# Patient Record
Sex: Female | Born: 1985 | Race: White | Hispanic: Yes | Marital: Single | Smoking: Former smoker
Health system: Southern US, Community
[De-identification: ages and names within clinical notes are randomized; demographics above are authoritative.]

## PROBLEM LIST (undated history)

## (undated) DIAGNOSIS — R87619 Unspecified abnormal cytological findings in specimens from cervix uteri: Secondary | ICD-10-CM

## (undated) DIAGNOSIS — F32A Depression, unspecified: Secondary | ICD-10-CM

## (undated) DIAGNOSIS — IMO0002 Reserved for concepts with insufficient information to code with codable children: Secondary | ICD-10-CM

## (undated) DIAGNOSIS — F329 Major depressive disorder, single episode, unspecified: Secondary | ICD-10-CM

## (undated) DIAGNOSIS — E7800 Pure hypercholesterolemia, unspecified: Secondary | ICD-10-CM

## (undated) DIAGNOSIS — F419 Anxiety disorder, unspecified: Secondary | ICD-10-CM

## (undated) HISTORY — DX: Anxiety disorder, unspecified: F41.9

## (undated) HISTORY — DX: Pure hypercholesterolemia, unspecified: E78.00

## (undated) HISTORY — DX: Reserved for concepts with insufficient information to code with codable children: IMO0002

## (undated) HISTORY — DX: Depression, unspecified: F32.A

## (undated) HISTORY — DX: Major depressive disorder, single episode, unspecified: F32.9

## (undated) HISTORY — DX: Unspecified abnormal cytological findings in specimens from cervix uteri: R87.619

---

## 2010-09-28 HISTORY — PX: COLPOSCOPY: SHX161

## 2011-03-27 ENCOUNTER — Other Ambulatory Visit: Payer: Self-pay | Admitting: Obstetrics & Gynecology

## 2011-03-27 ENCOUNTER — Encounter (INDEPENDENT_AMBULATORY_CARE_PROVIDER_SITE_OTHER): Payer: Self-pay | Admitting: Obstetrics & Gynecology

## 2011-03-27 ENCOUNTER — Other Ambulatory Visit (HOSPITAL_COMMUNITY)
Admission: RE | Admit: 2011-03-27 | Discharge: 2011-03-27 | Disposition: A | Discharge status: Home / Self Care | Payer: Self-pay | Source: Ambulatory Visit | Attending: Obstetrics & Gynecology | Admitting: Obstetrics & Gynecology

## 2011-03-27 DIAGNOSIS — R87619 Unspecified abnormal cytological findings in specimens from cervix uteri: Secondary | ICD-10-CM | POA: Insufficient documentation

## 2011-03-27 DIAGNOSIS — R87610 Atypical squamous cells of undetermined significance on cytologic smear of cervix (ASC-US): Secondary | ICD-10-CM

## 2011-05-06 ENCOUNTER — Encounter: Payer: Self-pay | Admitting: *Deleted

## 2011-05-06 DIAGNOSIS — F32A Depression, unspecified: Secondary | ICD-10-CM | POA: Insufficient documentation

## 2011-05-06 DIAGNOSIS — E7800 Pure hypercholesterolemia, unspecified: Secondary | ICD-10-CM | POA: Insufficient documentation

## 2011-05-06 DIAGNOSIS — F419 Anxiety disorder, unspecified: Secondary | ICD-10-CM | POA: Insufficient documentation

## 2011-05-06 DIAGNOSIS — F329 Major depressive disorder, single episode, unspecified: Secondary | ICD-10-CM | POA: Insufficient documentation

## 2011-05-06 DIAGNOSIS — R87612 Low grade squamous intraepithelial lesion on cytologic smear of cervix (LGSIL): Secondary | ICD-10-CM | POA: Insufficient documentation

## 2011-05-06 DIAGNOSIS — R87629 Unspecified abnormal cytological findings in specimens from vagina: Secondary | ICD-10-CM

## 2011-05-06 DIAGNOSIS — IMO0002 Reserved for concepts with insufficient information to code with codable children: Secondary | ICD-10-CM | POA: Insufficient documentation

## 2011-05-14 ENCOUNTER — Ambulatory Visit (INDEPENDENT_AMBULATORY_CARE_PROVIDER_SITE_OTHER): Payer: Self-pay | Admitting: Obstetrics & Gynecology

## 2011-05-14 ENCOUNTER — Encounter: Payer: Self-pay | Admitting: Obstetrics & Gynecology

## 2011-05-14 VITALS — BP 124/84 | HR 91 | Temp 98.1°F | Ht 69.0 in | Wt 219.5 lb

## 2011-05-14 DIAGNOSIS — R87612 Low grade squamous intraepithelial lesion on cytologic smear of cervix (LGSIL): Secondary | ICD-10-CM

## 2011-05-14 DIAGNOSIS — IMO0002 Reserved for concepts with insufficient information to code with codable children: Secondary | ICD-10-CM

## 2011-05-14 NOTE — Progress Notes (Signed)
Patient had LGSIL pap on 12/11/10.  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  For her cervical dysplasia, patient needs pap smears every six months until she has two consecutive normal pap smears, then she can resume annual screening. If any pap smear is abnormal, she will need repeat colposcopy and appropriate evaluation. Patient agrees with plan.

## 2014-07-30 ENCOUNTER — Encounter: Payer: Self-pay | Admitting: Obstetrics & Gynecology

## 2016-04-21 ENCOUNTER — Encounter (HOSPITAL_COMMUNITY): Payer: Self-pay | Admitting: Vascular Surgery

## 2016-04-21 ENCOUNTER — Emergency Department (HOSPITAL_COMMUNITY): Payer: Managed Care, Other (non HMO)

## 2016-04-21 ENCOUNTER — Emergency Department (HOSPITAL_COMMUNITY)
Admission: EM | Admit: 2016-04-21 | Discharge: 2016-04-21 | Disposition: A | Discharge status: Home / Self Care | Payer: Managed Care, Other (non HMO) | Attending: Emergency Medicine | Admitting: Emergency Medicine

## 2016-04-21 DIAGNOSIS — R0789 Other chest pain: Secondary | ICD-10-CM | POA: Diagnosis not present

## 2016-04-21 DIAGNOSIS — N760 Acute vaginitis: Secondary | ICD-10-CM | POA: Diagnosis not present

## 2016-04-21 DIAGNOSIS — B9689 Other specified bacterial agents as the cause of diseases classified elsewhere: Secondary | ICD-10-CM | POA: Diagnosis not present

## 2016-04-21 DIAGNOSIS — R1031 Right lower quadrant pain: Secondary | ICD-10-CM | POA: Diagnosis present

## 2016-04-21 DIAGNOSIS — Z87891 Personal history of nicotine dependence: Secondary | ICD-10-CM | POA: Insufficient documentation

## 2016-04-21 LAB — POC URINE PREG, ED: PREG TEST UR: NEGATIVE

## 2016-04-21 LAB — CBC
HEMATOCRIT: 45.9 % (ref 36.0–46.0)
Hemoglobin: 15.3 g/dL — ABNORMAL HIGH (ref 12.0–15.0)
MCH: 30.2 pg (ref 26.0–34.0)
MCHC: 33.3 g/dL (ref 30.0–36.0)
MCV: 90.7 fL (ref 78.0–100.0)
Platelets: 365 10*3/uL (ref 150–400)
RBC: 5.06 MIL/uL (ref 3.87–5.11)
RDW: 13.2 % (ref 11.5–15.5)
WBC: 12.4 10*3/uL — AB (ref 4.0–10.5)

## 2016-04-21 LAB — URINE MICROSCOPIC-ADD ON: RBC / HPF: NONE SEEN RBC/hpf (ref 0–5)

## 2016-04-21 LAB — COMPREHENSIVE METABOLIC PANEL
ALK PHOS: 72 U/L (ref 38–126)
ALT: 9 U/L — AB (ref 14–54)
AST: 20 U/L (ref 15–41)
Albumin: 4.4 g/dL (ref 3.5–5.0)
Anion gap: 9 (ref 5–15)
BUN: 7 mg/dL (ref 6–20)
CALCIUM: 8.9 mg/dL (ref 8.9–10.3)
CO2: 22 mmol/L (ref 22–32)
CREATININE: 0.57 mg/dL (ref 0.44–1.00)
Chloride: 104 mmol/L (ref 101–111)
GFR calc non Af Amer: >60 mL/min (ref >60)
Glucose, Bld: 95 mg/dL (ref 65–99)
Potassium: 3.6 mmol/L (ref 3.5–5.1)
SODIUM: 135 mmol/L (ref 135–145)
Total Bilirubin: 1.2 mg/dL (ref 0.3–1.2)
Total Protein: 7.5 g/dL (ref 6.5–8.1)

## 2016-04-21 LAB — WET PREP, GENITAL
Sperm: NONE SEEN
TRICH WET PREP: NONE SEEN
Yeast Wet Prep HPF POC: NONE SEEN

## 2016-04-21 LAB — I-STAT TROPONIN, ED: Troponin i, poc: 0 ng/mL (ref 0.00–0.08)

## 2016-04-21 LAB — URINALYSIS, ROUTINE W REFLEX MICROSCOPIC
GLUCOSE, UA: NEGATIVE mg/dL
HGB URINE DIPSTICK: NEGATIVE
KETONES UR: NEGATIVE mg/dL
NITRITE: NEGATIVE
PH: 6 (ref 5.0–8.0)
Protein, ur: NEGATIVE mg/dL
Specific Gravity, Urine: 1.023 (ref 1.005–1.030)

## 2016-04-21 LAB — POC OCCULT BLOOD, ED: FECAL OCCULT BLD: NEGATIVE

## 2016-04-21 MED ORDER — METRONIDAZOLE 500 MG PO TABS: 500.0000 mg | ORAL_TABLET | Freq: Two times a day (BID) | ORAL | 0 refills | Status: AC

## 2016-04-21 MED ORDER — IOPAMIDOL (ISOVUE-300) INJECTION 61%
INTRAVENOUS | Status: AC
  Administered 2016-04-21: 100 mL
  Filled 2016-04-21: qty 100

## 2016-04-21 NOTE — ED Provider Notes (Signed)
[facility]-EMERGENCY DEPT Provider Note   CSN: [id number] Arrival date & time: 04/21/16  1059  First Provider Contact:  None       History   Chief Complaint Chief Complaint  Patient presents with  . Abdominal Pain  . Chest Pain    HPI Tina Hoffman is a 30 y.o. female.  The history is provided by the patient.  Abdominal Pain   This is a new problem. Episode onset: 2 weeks. The problem occurs constantly. The pain is located in the periumbilical region and RLQ. The pain is moderate. Associated symptoms include diarrhea, nausea, vomiting and constipation. Pertinent negatives include fever, dysuria, hematuria and arthralgias. The symptoms are aggravated by activity. Her past medical history does not include GERD.  Chest Pain   Associated symptoms include abdominal pain, nausea and vomiting. Pertinent negatives include no back pain, no cough, no fever, no palpitations and no shortness of breath.  Pertinent negatives for past medical history include no seizures.    Past Medical History:  Diagnosis Date  . Abnormal Pap smear   . Anxiety   . Anxiety   . Depression   . Depression   . Hypercholesteremia     Patient Active Problem List   Diagnosis Date Noted  . LGSIL (low grade squamous intraepithelial lesion) on Pap smear 05/06/2011  . Depression   . Anxiety   . Hypercholesteremia     Past Surgical History:  Procedure Laterality Date  . COLPOSCOPY  2012    OB History    Gravida Para Term Preterm AB Living   4       4 0   SAB TAB Ectopic Multiple Live Births   1 3             Home Medications    Prior to Admission medications   Medication Sig Start Date End Date Taking? Authorizing Provider  ALPRAZolam (XANAX) 0.25 MG tablet Take 0.25 mg by mouth at bedtime as needed.      Historical Provider, MD  amitriptyline (ELAVIL) 10 MG tablet Take 10 mg by mouth at bedtime.      Historical Provider, MD  metroNIDAZOLE (FLAGYL) 500 MG tablet Take 1 tablet (500 mg total) by  mouth 2 (two) times daily. 04/21/16   Tery Sanfilippo, MD    Family History Family History  Problem Relation Age of Onset  . Cancer Paternal Grandmother     ovarian  . Heart disease Paternal Grandfather   . Stroke Paternal Grandfather     Social History Social History  Substance Use Topics  . Smoking status: Former Smoker    Years: 10.00    Types: Cigarettes    Quit date: 05/13/2010  . Smokeless tobacco: Former Neurosurgeon  . Alcohol use Yes     Comment: occasionally     Allergies   Review of patient's allergies indicates no known allergies.   Review of Systems Review of Systems  Constitutional: Negative for chills and fever.  HENT: Negative for ear pain and sore throat.   Eyes: Negative for pain and visual disturbance.  Respiratory: Negative for cough and shortness of breath.   Cardiovascular: Positive for chest pain. Negative for palpitations.  Gastrointestinal: Positive for abdominal pain, blood in stool, constipation, diarrhea, nausea and vomiting.  Genitourinary: Negative for dysuria, flank pain and hematuria.  Musculoskeletal: Negative for arthralgias and back pain.  Skin: Negative for color change and rash.  Neurological: Negative for seizures and syncope.  All other systems reviewed and are negative.  Physical Exam Updated Vital Signs BP 97/72 (BP Location: Right Arm)   Pulse 78   Temp 98.6 F (37 C) (Oral)   Resp 21   LMP 04/07/2016   SpO2 100%   Physical Exam  Constitutional: She appears well-developed and well-nourished. No distress.  HENT:  Head: Normocephalic and atraumatic.  Eyes: Conjunctivae are normal.  Neck: Neck supple.  Cardiovascular: Normal rate and regular rhythm.   No murmur heard. Pulmonary/Chest: Effort normal and breath sounds normal. No respiratory distress.  Abdominal: Soft. There is tenderness in the right lower quadrant. There is tenderness at McBurney's point. There is no rigidity, no rebound, no guarding, no CVA tenderness and  negative Murphy's sign.  Genitourinary: Rectum normal, vagina normal and uterus normal. Pelvic exam was performed with patient prone. Cervix exhibits no motion tenderness, no discharge and no friability.  Musculoskeletal: She exhibits no edema.  Neurological: She is alert.  Skin: Skin is warm and dry.  Psychiatric: She has a normal mood and affect.  Nursing note and vitals reviewed.    ED Treatments / Results  Labs (all labs ordered are listed, but only abnormal results are displayed) Labs Reviewed  WET PREP, GENITAL - Abnormal; Notable for the following:       Result Value   Clue Cells Wet Prep HPF POC PRESENT (*)    WBC, Wet Prep HPF POC MODERATE (*)    All other components within normal limits  CBC - Abnormal; Notable for the following:    WBC 12.4 (*)    Hemoglobin 15.3 (*)    All other components within normal limits  COMPREHENSIVE METABOLIC PANEL - Abnormal; Notable for the following:    ALT 9 (*)    All other components within normal limits  URINALYSIS, ROUTINE W REFLEX MICROSCOPIC (NOT AT [facility]) - Abnormal; Notable for the following:    Color, Urine AMBER (*)    Bilirubin Urine SMALL (*)    Leukocytes, UA TRACE (*)    All other components within normal limits  URINE MICROSCOPIC-ADD ON - Abnormal; Notable for the following:    Squamous Epithelial / LPF 6-30 (*)    Bacteria, UA MANY (*)    All other components within normal limits  URINE CULTURE  I-STAT TROPOININ, ED  POC URINE PREG, ED  POC OCCULT BLOOD, ED  GC/CHLAMYDIA PROBE AMP ([place]) NOT AT [facility]    EKG  EKG Interpretation None       Radiology Dg Chest 2 View  Result Date: 04/21/2016 CLINICAL DATA:  Pt states she is having a BG attack, mid chest pains on and off also x 4 mos, non smoker, no known htn or diabetes EXAM: CHEST  2 VIEW COMPARISON:  None. FINDINGS: patient rotated minimally right on the frontal radiograph. Midline trachea. Normal heart size and mediastinal contours. No pleural effusion  or pneumothorax. IMPRESSION: No acute cardiopulmonary disease. Electronically Signed   By: Jeronimo Greaves M.D.   On: 04/21/2016 13:37  Ct Abdomen Pelvis W Contrast  Result Date: 04/21/2016 CLINICAL DATA:  Lower pelvic pain for 2 weeks with nausea. Right lower quadrant abdominal pain. EXAM: CT ABDOMEN AND PELVIS WITH CONTRAST TECHNIQUE: Multidetector CT imaging of the abdomen and pelvis was performed using the standard protocol following bolus administration of intravenous contrast. CONTRAST:  ISOVUE-300 IOPAMIDOL (ISOVUE-300) INJECTION 61% COMPARISON:  CT abdomen and pelvis 10/18/2011. FINDINGS: Lower chest: The lung bases are clear without focal nodule, mass, or airspace disease. The heart size is normal. No significant pleural  or pericardial effusion is present. Hepatobiliary: No masses or other significant abnormality. Pancreas: No mass, inflammatory changes, or other significant abnormality. Spleen: Within normal limits in size and appearance. Adrenals/Urinary Tract: The adrenal glands are normal bilaterally. The kidneys and ureters are unremarkable. Urinary bladder is within normal limits. Stomach/Bowel: The stomach and duodenum are within normal limits. Small bowel is unremarkable. Appendix is visualized and within normal limits. The ascending and transverse colon are within normal limits. The descending and rectosigmoid colon are within normal limits as well. Vascular/Lymphatic: No significant vascular lesions are present. There is no significant adenopathy. Reproductive: The uterus is within normal limits. A 2 cm follicle demonstrates peripheral enhancement in the left ovary. This likely represents an involuting corpus luteal cyst. A small amount of free fluid is present in the anatomic pelvis. The adnexa are otherwise unremarkable. Other: None Musculoskeletal: Bone windows are unremarkable. No focal lytic or blastic lesions are present. The anatomic pelvis is intact. IMPRESSION: 1. Small amount of  free fluid within the anatomic pelvis is likely physiologic. 2. 2 cm follicle in the left adnexa likely represents an involuting corpus luteal cyst. 3. No other acute or focal lesion to explain the patient's symptoms. Electronically Signed   By: Marin Roberts M.D.   On: 04/21/2016 14:41   Procedures Procedures (including critical care time)  Medications Ordered in ED Medications  iopamidol (ISOVUE-300) 61 % injection (100 mLs  Contrast Given 04/21/16 1411)     Initial Impression / Assessment and Plan / ED Course  I have reviewed the triage vital signs and the nursing notes.  Pertinent labs & imaging results that were available during my care of the patient were reviewed by me and considered in my medical decision making (see chart for details).  Clinical Course    30 year old female presenting to the emergency department for evaluation of abdominal pain. Reports this is been intermittent over the last several months. Was seen at outside hospital today where workup was negative but significant for cholelithiasis without cholecystitis. The patient reports she was advised by a nurse to present to [facility] for repeat evaluation.  On evaluation the patient is imminently stable and in no acute distress. Abdomen with only mild right lower quadrant tenderness. Reviewed the image reads from outside hospital which indicate: Lithiasis and renal calculi without any nephrolithiasis or cholecystitis at this time.  CBC with mild leukocytosis. CMP and lipase unremarkable. CT abdomen and pelvis without acute intra-abdominal pathology. Pelvic exam without any abnormalities. Reports blood in her stool several weeks prior with no bright red blood per rectum on exam today and Hemoccult negative. Wet prep with clue cells. Given Rx for metronidazole and if discharge is present she can take. Currently denies any dysuria and some bacteria on her UA. Urine culture sent. Will currently hold and await culture  for possible treatment in the future.No right upper quadrant pain and low suspicion for symptomatic cholelithiasis as patient has is pain only intermittently and not associated with meals. Given f/u information for surgery.   Patient also reports chest pain 4 weeks ago that is been intermittent. Initial ECG with mild tachycardia but on exam no tachycardia or hypoxia. Otherwise no risk factors and Wells low risk do not feel d-dimer warranted at this time. Chest x-ray performed without cardiopulmonary abnormalities. ECG without acute ischemic changes. Troponin negative and do not feel delta warranted. Heart score low risk.  Labs and ECG were viewed by myself  incorporated into medical decision making.  Discussed pertinent  finding with patient or caregiver prior to discharge with no further questions.  Immediate return precautions given and understood.  Medical decision making supervised by my attending Dr. Patria Mane.   Tery Sanfilippo, MD PGY-3 Emergency Medicine     Final Clinical Impressions(s) / ED Diagnoses   Final diagnoses:  Bacterial vaginosis  Atypical chest pain    New Prescriptions Discharge Medication List as of 04/21/2016  3:47 PM    START taking these medications   Details  metroNIDAZOLE (FLAGYL) 500 MG tablet Take 1 tablet (500 mg total) by mouth 2 (two) times daily., Starting Tue 04/21/2016, Print         Tery Sanfilippo, MD 04/21/16 1720    Azalia Bilis, MD 04/22/16 [phone]

## 2016-04-21 NOTE — ED Triage Notes (Signed)
Pt reports to the ED for eval of RUQ abd pain and midsternal CP. She was seen and had an U/S done and she was dx with gall stones. She reports her father almost died from gall stones so she wants it checked out. Pt A&Ox4, resp e/u, and skin warm and dry.

## 2016-04-22 LAB — GC/CHLAMYDIA PROBE AMP (~~LOC~~) NOT AT ARMC
CHLAMYDIA, DNA PROBE: NEGATIVE
NEISSERIA GONORRHEA: NEGATIVE

## 2016-04-22 LAB — URINE CULTURE

## 2017-02-05 IMAGING — CT CT ABD-PELV W/ CM
2 of 4 series · 16 of 46 positions shown, 18 images · IV contrast (Omni 300)
Comparison: CT abdomen and pelvis 10/18/2011.

CLINICAL DATA: Lower pelvic pain for 2 weeks with nausea. Right
lower quadrant abdominal pain.

EXAM:
CT ABDOMEN AND PELVIS WITH CONTRAST
TECHNIQUE: Multidetector CT imaging of the abdomen and pelvis was performed
using the standard protocol following bolus administration of
intravenous contrast.
CONTRAST:  100mL 3Q8HBX-A99 IOPAMIDOL (3Q8HBX-A99) INJECTION 61%

[Series 2: a/p w/ 5mm · axial · 0.68mm/px · z∈[-582,-76]mm · 13 of 111 slices shown, 15 images]
[im 5/111  soft-tissue]
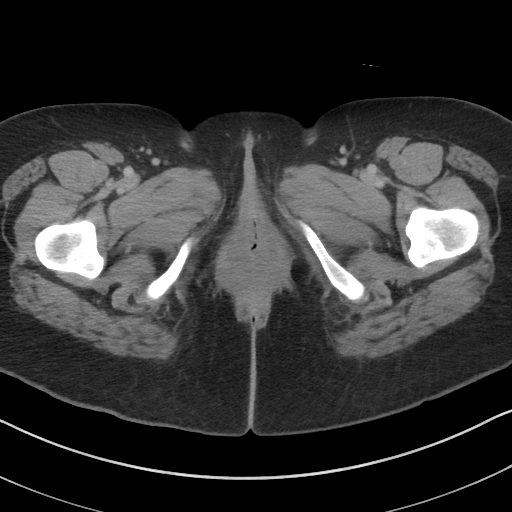
[im 5/111  bone]
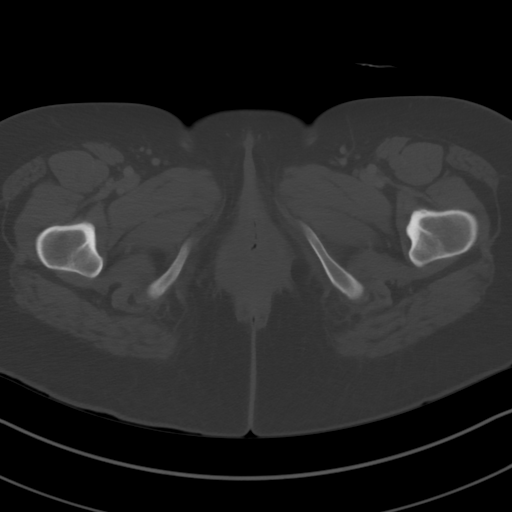
[im 15/111  soft-tissue]
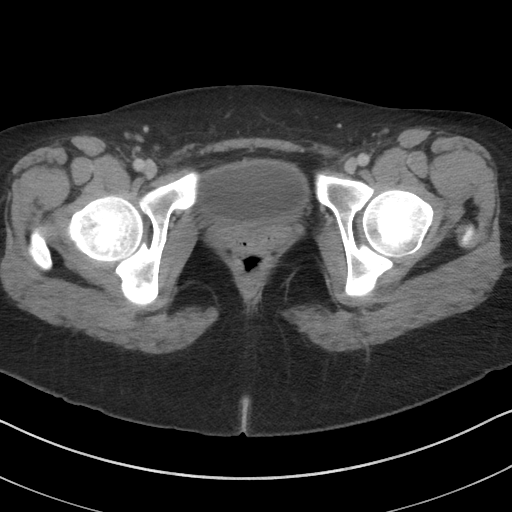
[im 24/111  soft-tissue]
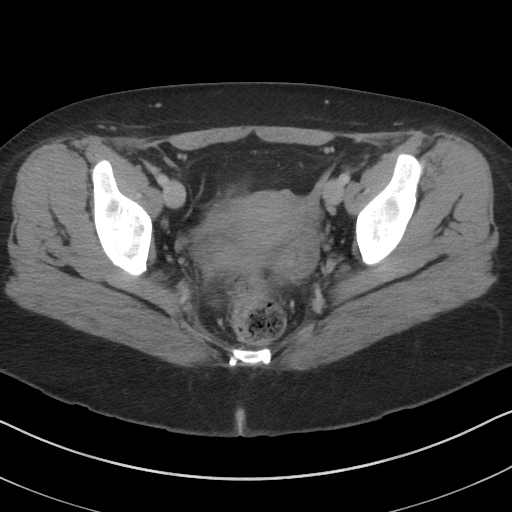
[im 29/111  soft-tissue]
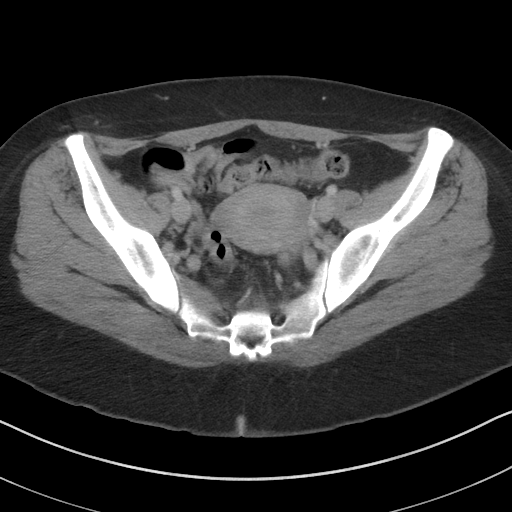
[im 39/111  soft-tissue]
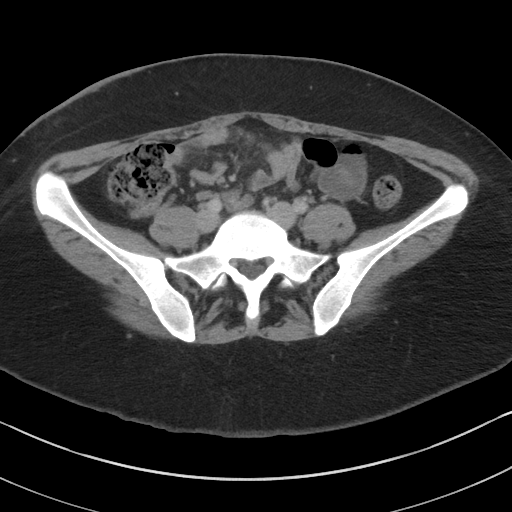
[im 48/111  soft-tissue]
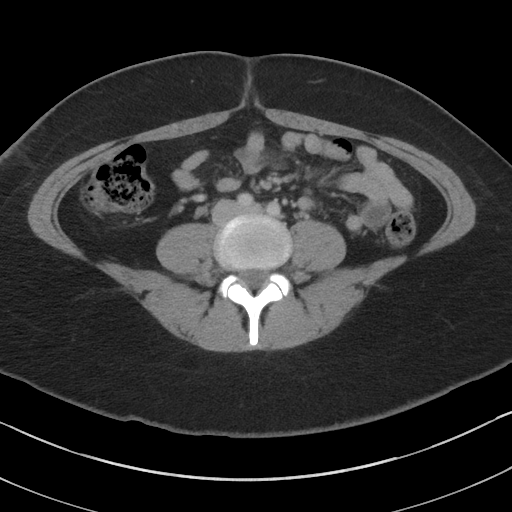
[im 58/111  soft-tissue]
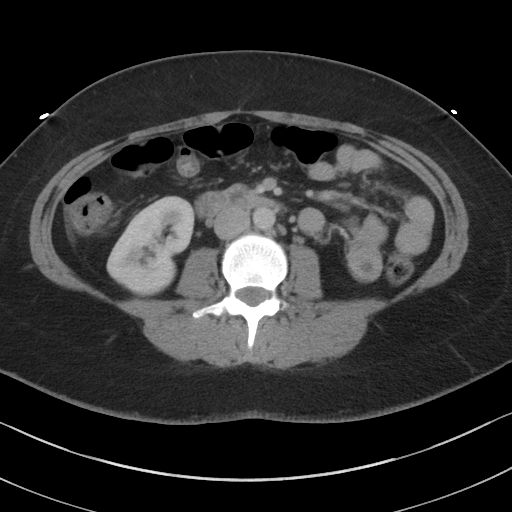
[im 63/111  soft-tissue]
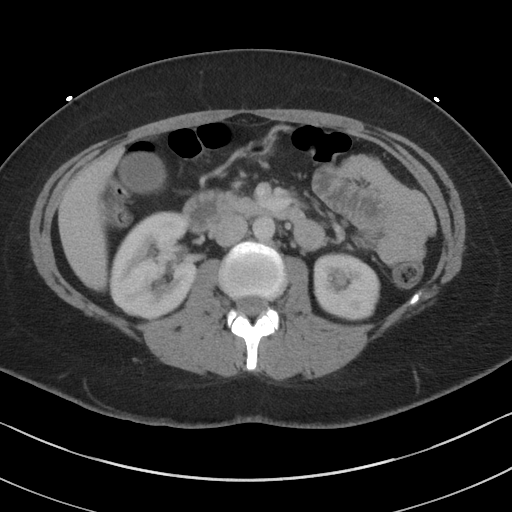
[im 72/111  soft-tissue]
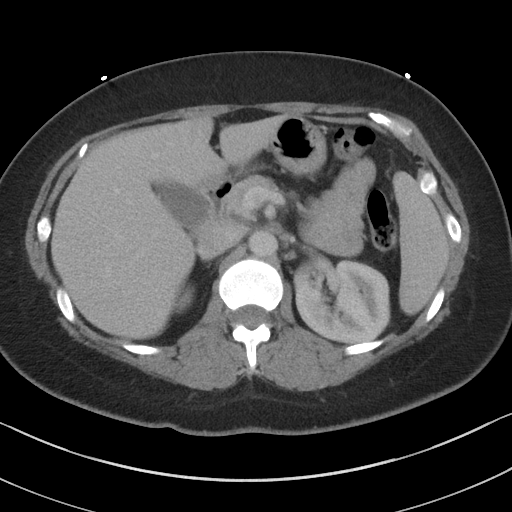
[im 72/111  bone]
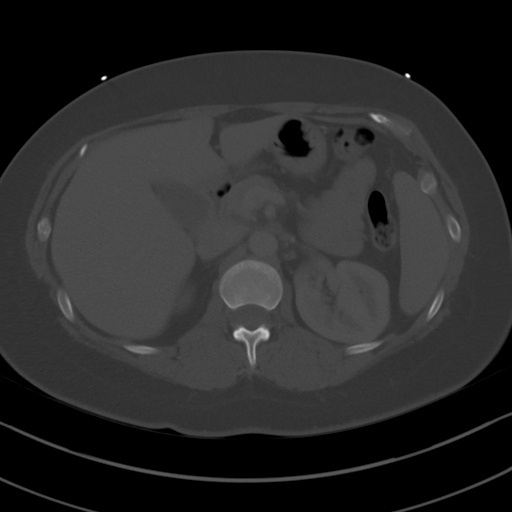
[im 82/111  soft-tissue]
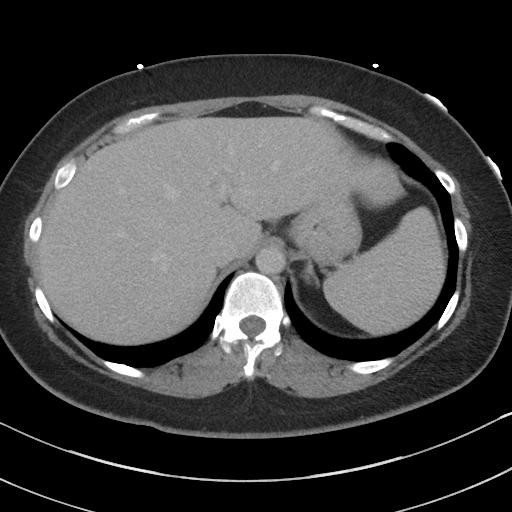
[im 87/111  soft-tissue]
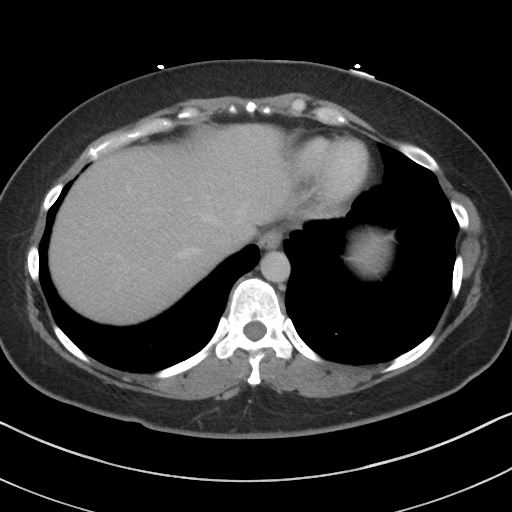
[im 96/111  soft-tissue]
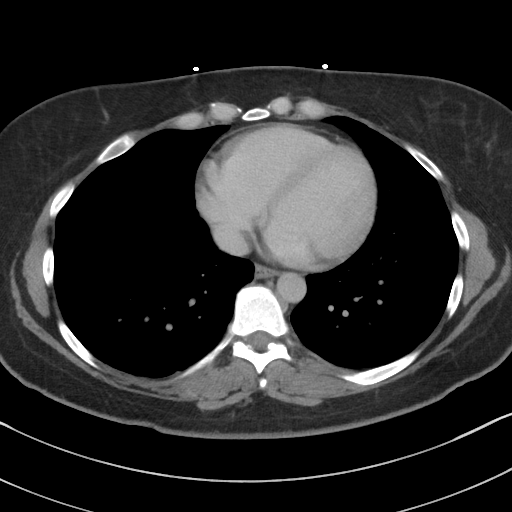
[im 106/111  soft-tissue]
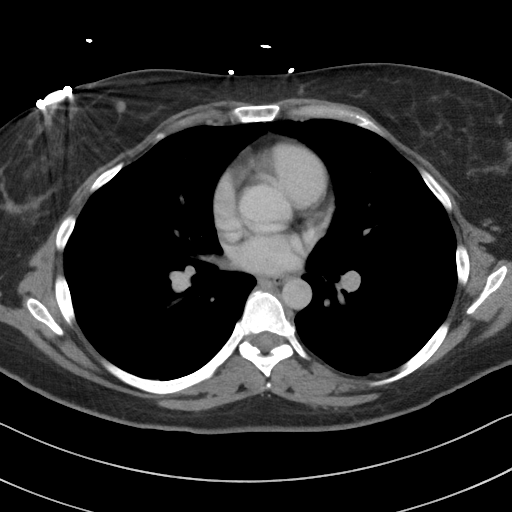

[Series 5: a/p w/ cor · coronal · 0.85mm/px · 3 of 103 slices shown]
[im 35/103  soft-tissue]
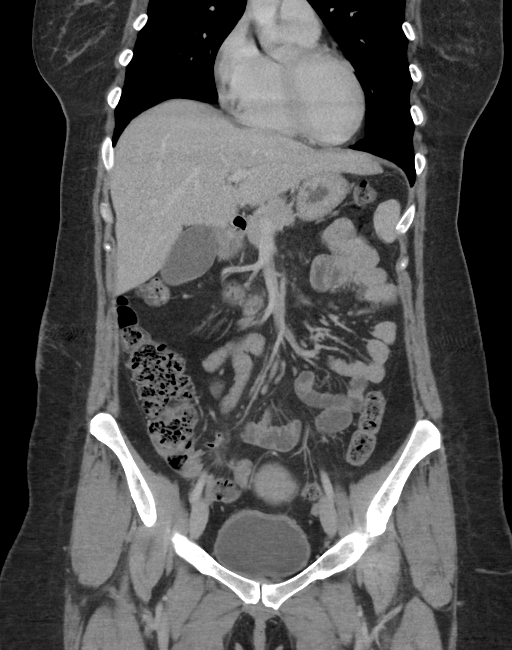
[im 46/103  soft-tissue]
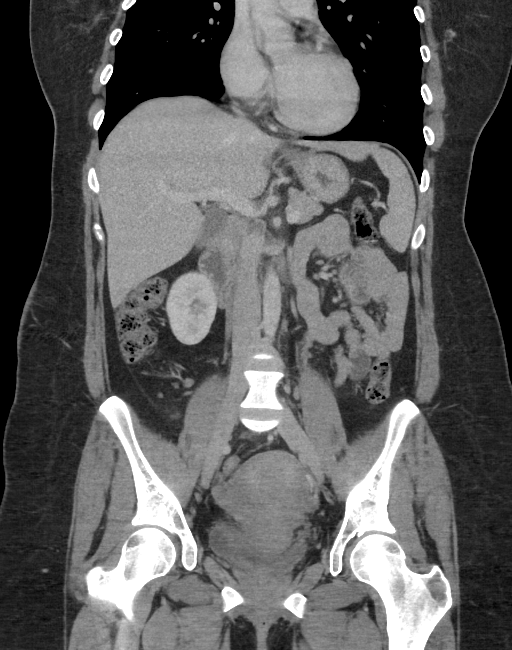
[im 57/103  soft-tissue]
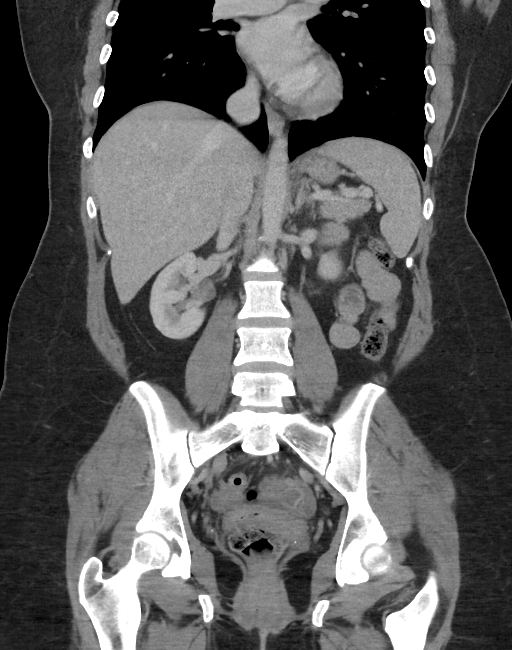

[16 of 46 positions shown; findings below may reference images not displayed]

FINDINGS: Lower chest: The lung bases are clear without focal nodule, mass, or
airspace disease. The heart size is normal. No significant pleural
or pericardial effusion is present.

Hepatobiliary: No masses or other significant abnormality.

Pancreas: No mass, inflammatory changes, or other significant
abnormality.

Spleen: Within normal limits in size and appearance.

Adrenals/Urinary Tract: The adrenal glands are normal bilaterally.
The kidneys and ureters are unremarkable. Urinary bladder is within
normal limits.

Stomach/Bowel: The stomach and duodenum are within normal limits.
Small bowel is unremarkable. Appendix is visualized and within
normal limits. The ascending and transverse colon are within normal
limits. The descending and rectosigmoid colon are within normal
limits as well.

Vascular/Lymphatic: No significant vascular lesions are present.
There is no significant adenopathy.

Reproductive: The uterus is within normal limits. A 2 cm follicle
demonstrates peripheral enhancement in the left ovary. This likely
represents an involuting corpus luteal cyst. A small amount of free
fluid is present in the anatomic pelvis. The adnexa are otherwise
unremarkable.

Other: None

Musculoskeletal: Bone windows are unremarkable. No focal lytic or
blastic lesions are present. The anatomic pelvis is intact.
IMPRESSION: 1. Small amount of free fluid within the anatomic pelvis is likely
physiologic.
2. 2 cm follicle in the left adnexa likely represents an involuting
corpus luteal cyst.
3. No other acute or focal lesion to explain the patient's symptoms.

## 2017-02-05 IMAGING — CR DG CHEST 2V
2 series · 2 of 2 positions shown · non-contrast
Comparison: None.

CLINICAL DATA: Pt states she is having a BG attack, mid chest pains
on and off also x 4 mos, non smoker, no known htn or diabetes

EXAM:
CHEST  2 VIEW

[chest pa]
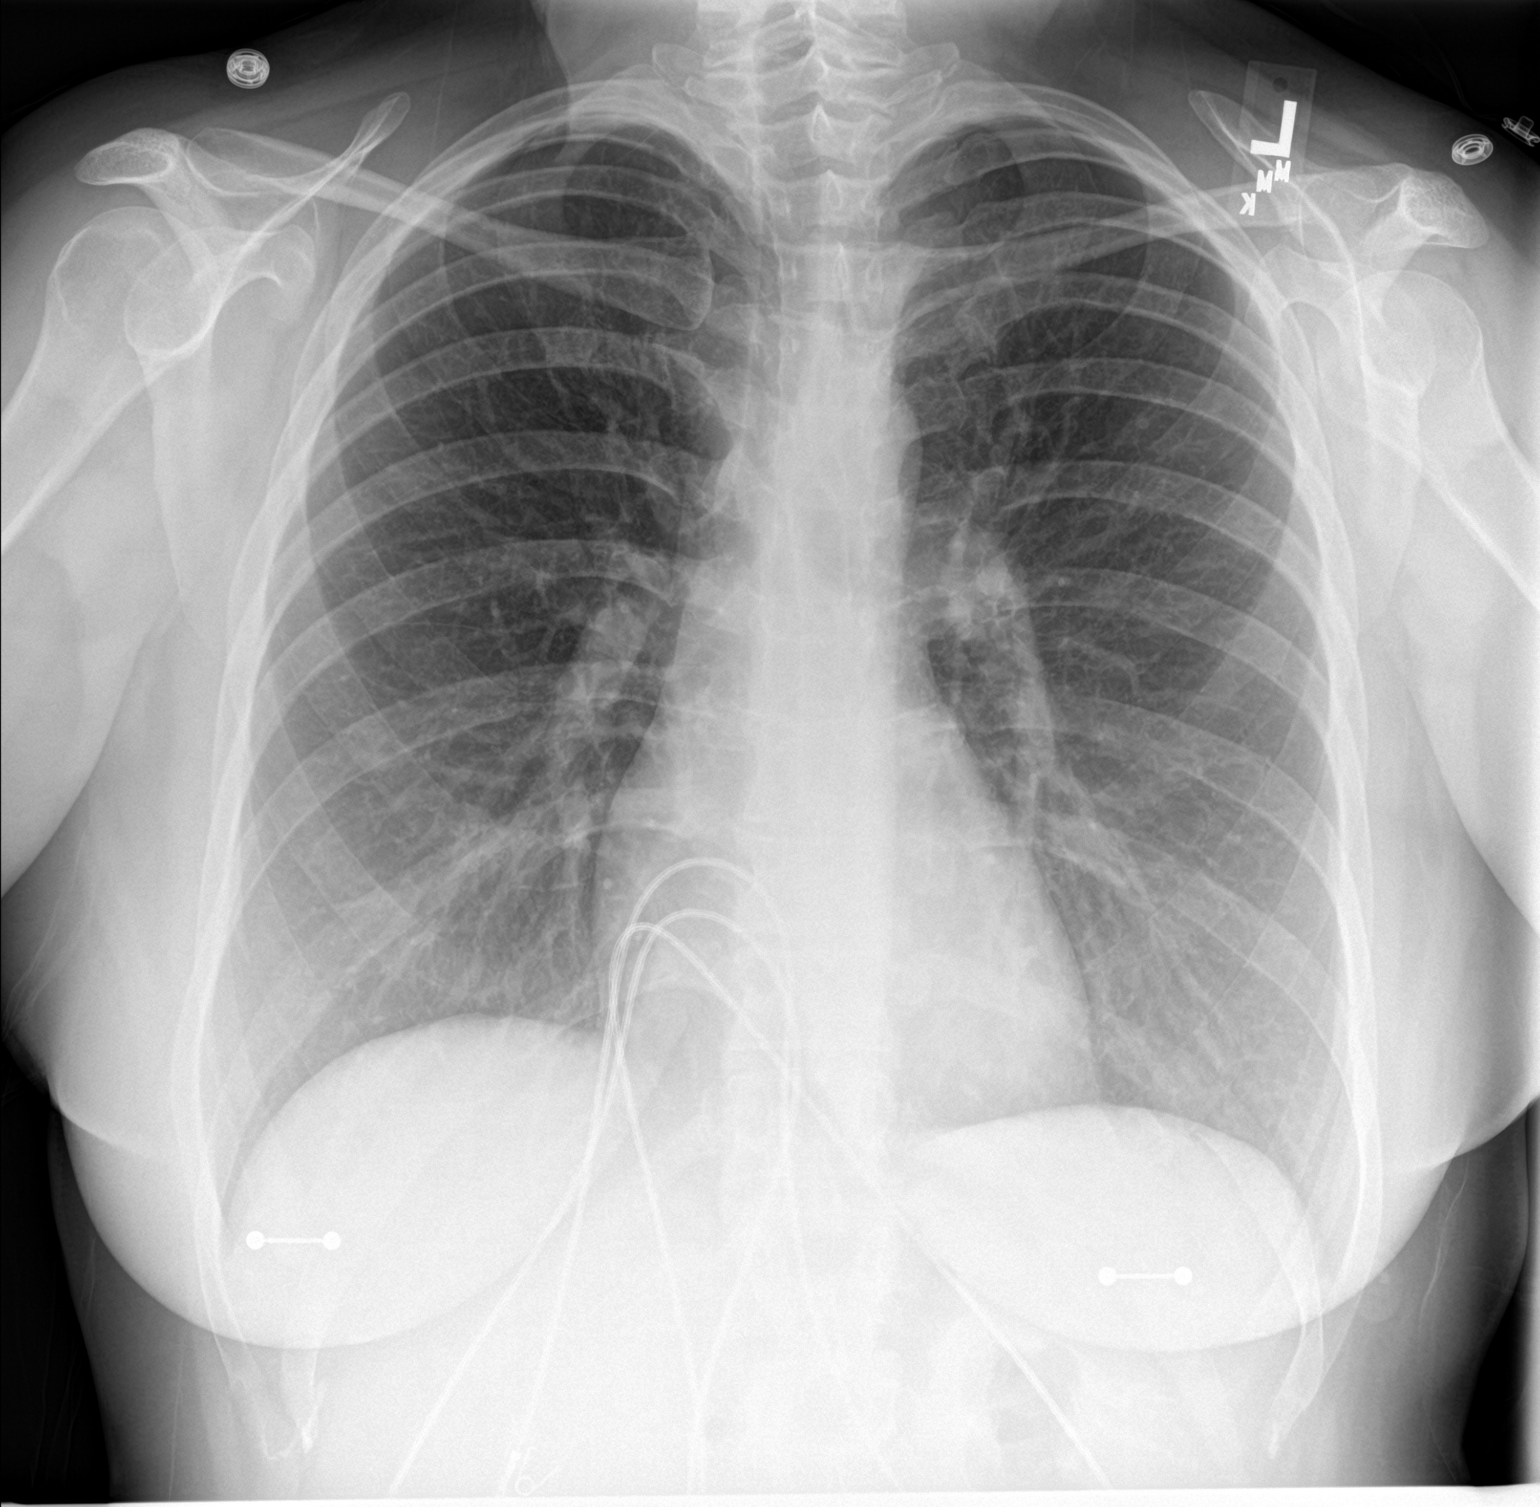

[chest lat]
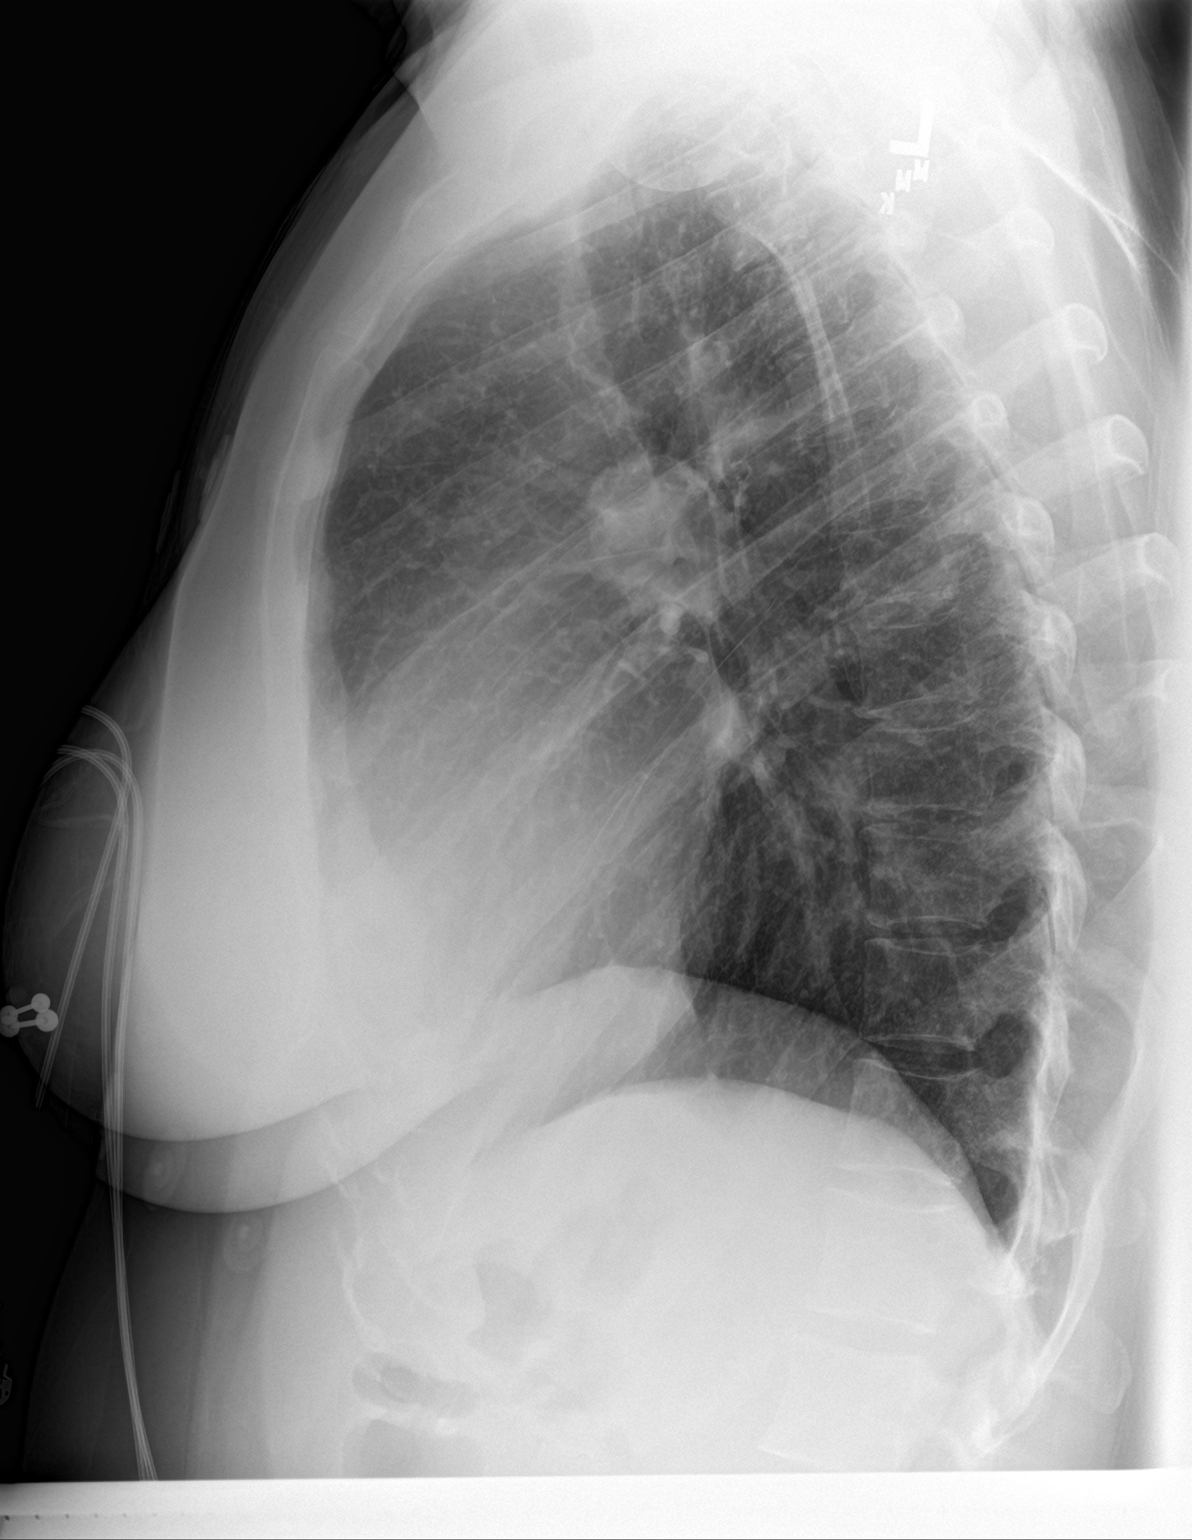

[2 of 2 positions shown; findings below may reference images not displayed]

FINDINGS: patient rotated minimally right on the frontal radiograph. Midline
trachea. Normal heart size and mediastinal contours. No pleural
effusion or pneumothorax.
IMPRESSION: No acute cardiopulmonary disease.

## 2022-09-07 ENCOUNTER — Emergency Department (HOSPITAL_BASED_OUTPATIENT_CLINIC_OR_DEPARTMENT_OTHER)
Admission: EM | Admit: 2022-09-07 | Discharge: 2022-09-07 | Disposition: A | Discharge status: Home / Self Care | Payer: BC Managed Care – PPO | Attending: Emergency Medicine | Admitting: Emergency Medicine

## 2022-09-07 ENCOUNTER — Encounter (HOSPITAL_BASED_OUTPATIENT_CLINIC_OR_DEPARTMENT_OTHER): Payer: Self-pay | Admitting: Pediatrics

## 2022-09-07 ENCOUNTER — Emergency Department (HOSPITAL_BASED_OUTPATIENT_CLINIC_OR_DEPARTMENT_OTHER): Payer: BC Managed Care – PPO

## 2022-09-07 ENCOUNTER — Other Ambulatory Visit: Payer: Self-pay

## 2022-09-07 DIAGNOSIS — N200 Calculus of kidney: Secondary | ICD-10-CM

## 2022-09-07 DIAGNOSIS — N132 Hydronephrosis with renal and ureteral calculous obstruction: Secondary | ICD-10-CM | POA: Insufficient documentation

## 2022-09-07 DIAGNOSIS — R109 Unspecified abdominal pain: Secondary | ICD-10-CM | POA: Diagnosis present

## 2022-09-07 LAB — URINALYSIS, ROUTINE W REFLEX MICROSCOPIC
Bilirubin Urine: NEGATIVE
Glucose, UA: NEGATIVE mg/dL
Ketones, ur: NEGATIVE mg/dL
Leukocytes,Ua: NEGATIVE
Nitrite: NEGATIVE
Protein, ur: NEGATIVE mg/dL
Specific Gravity, Urine: 1.015 (ref 1.005–1.030)
pH: 6 (ref 5.0–8.0)

## 2022-09-07 LAB — COMPREHENSIVE METABOLIC PANEL
ALT: 13 U/L (ref 0–44)
AST: 26 U/L (ref 15–41)
Albumin: 4.2 g/dL (ref 3.5–5.0)
Alkaline Phosphatase: 78 U/L (ref 38–126)
Anion gap: 11 (ref 5–15)
BUN: 8 mg/dL (ref 6–20)
CO2: 24 mmol/L (ref 22–32)
Calcium: 9.1 mg/dL (ref 8.9–10.3)
Chloride: 99 mmol/L (ref 98–111)
Creatinine, Ser: 0.53 mg/dL (ref 0.44–1.00)
GFR, Estimated: >60 mL/min (ref >60)
Glucose, Bld: 92 mg/dL (ref 70–99)
Potassium: 3 mmol/L — ABNORMAL LOW (ref 3.5–5.1)
Sodium: 134 mmol/L — ABNORMAL LOW (ref 135–145)
Total Bilirubin: 0.6 mg/dL (ref 0.3–1.2)
Total Protein: 8.1 g/dL (ref 6.5–8.1)

## 2022-09-07 LAB — CBC WITH DIFFERENTIAL/PLATELET
Abs Immature Granulocytes: 0.04 10*3/uL (ref 0.00–0.07)
Basophils Absolute: 0 10*3/uL (ref 0.0–0.1)
Basophils Relative: 1 %
Eosinophils Absolute: 0.1 10*3/uL (ref 0.0–0.5)
Eosinophils Relative: 1 %
HCT: 41.1 % (ref 36.0–46.0)
Hemoglobin: 14 g/dL (ref 12.0–15.0)
Immature Granulocytes: 1 %
Lymphocytes Relative: 21 %
Lymphs Abs: 1.7 10*3/uL (ref 0.7–4.0)
MCH: 31 pg (ref 26.0–34.0)
MCHC: 34.1 g/dL (ref 30.0–36.0)
MCV: 91.1 fL (ref 80.0–100.0)
Monocytes Absolute: 0.4 10*3/uL (ref 0.1–1.0)
Monocytes Relative: 5 %
Neutro Abs: 6 10*3/uL (ref 1.7–7.7)
Neutrophils Relative %: 71 %
Platelets: 321 10*3/uL (ref 150–400)
RBC: 4.51 MIL/uL (ref 3.87–5.11)
RDW: 12 % (ref 11.5–15.5)
WBC: 8.3 10*3/uL (ref 4.0–10.5)
nRBC: 0 % (ref 0.0–0.2)

## 2022-09-07 LAB — URINALYSIS, MICROSCOPIC (REFLEX)
Bacteria, UA: NONE SEEN
WBC, UA: NONE SEEN WBC/hpf (ref 0–5)

## 2022-09-07 LAB — PREGNANCY, URINE: Preg Test, Ur: NEGATIVE

## 2022-09-07 MED ORDER — KETOROLAC TROMETHAMINE 10 MG PO TABS: 10.0000 mg | ORAL_TABLET | Freq: Four times a day (QID) | ORAL | 0 refills | Status: AC | PRN

## 2022-09-07 MED ORDER — KETOROLAC TROMETHAMINE 15 MG/ML IJ SOLN
15.0000 mg | Freq: Once | INTRAMUSCULAR | Status: AC
  Administered 2022-09-07: 15 mg via INTRAVENOUS
  Filled 2022-09-07: qty 1

## 2022-09-07 NOTE — ED Triage Notes (Signed)
C/O left sided flank pain radiating down the hip at times x 1 week ago; reported was seen at [facility]; UA was + for blood; concern for kidney stones; negative for UTI.

## 2022-09-07 NOTE — ED Provider Triage Note (Signed)
Emergency Medicine Provider Triage Evaluation Note  Tina Hoffman , a 36 y.o. female  was evaluated in triage.  Pt complains of left flank pain going on for about a week.  Now starting to radiate to the left hip.  History of kidney stones.  Was seen at an urgent care with negative UA for UTI but however had some hematuria.  Endorses some dysuria as well.  Review of Systems  Positive: As above Negative: As above  Physical Exam  BP (!) 158/97 (BP Location: Right Arm)   Pulse 84   Temp 98.9 F (37.2 C) (Oral)   Resp 19   Ht 5\' 8"  (1.727 m)   Wt 113.4 kg   LMP 08/20/2022 (Approximate)   SpO2 100%   BMI 38.01 kg/m  Gen:   Awake, no distress   Resp:  Normal effort  MSK:   Moves extremities without difficulty  Other:    Medical Decision Making  Medically screening exam initiated at 5:37 PM.  Appropriate orders placed.  KHYLI SWAIM was informed that the remainder of the evaluation will be completed by another provider, this initial triage assessment does not replace that evaluation, and the importance of remaining in the ED until their evaluation is complete.     Earvin Hansen, PA-C 09/07/22 1737

## 2022-09-07 NOTE — Discharge Instructions (Signed)
Please take your medications as prescribed. Please take Toradol or tylenol/ibuprofen for pain. I recommend close follow-up with PCP for reevaluation.  Please do not hesitate to return to emergency department if worrisome signs symptoms we discussed become apparent.

## 2022-09-07 NOTE — ED Provider Notes (Signed)
[facility] EMERGENCY DEPARTMENT Provider Note   CSN: [id number] Arrival date & time: 09/07/22  1531     History {Add pertinent medical, surgical, social history, OB history to HPI:1} Chief Complaint  Patient presents with   Flank Pain    Tina Hoffman is a 36 y.o. female with a past medical history of anxiety, hypercholesterolemia presenting to the Emergency Department for evaluation of left flank pain.  Patient states the pain started in her lower back last week and it radiates to her left flank and left upper leg.  States the pain is constant, worse with breathing.  Patient was evaluated in urgent care, was found to have +Hbg in urine then sent to the ER for further evaluation.  Denies fever, nausea, vomiting, chest pain, shortness of breath, bowel changes.  States recently she has had pain with urination and increased urinary urgency.  Denies seeing blood in her stool or urine.   Flank Pain      Past Medical History:  Diagnosis Date   Abnormal Pap smear    Anxiety    Anxiety    Depression    Depression    Hypercholesteremia    Past Surgical History:  Procedure Laterality Date   COLPOSCOPY  2012     Home Medications Prior to Admission medications   Medication Sig Start Date End Date Taking? Authorizing Provider  ketorolac (TORADOL) 10 MG tablet Take 1 tablet (10 mg total) by mouth every 6 (six) hours as needed. 09/07/22  Yes Jeanelle Malling, PA  ALPRAZolam Prudy Feeler) 0.25 MG tablet Take 0.25 mg by mouth at bedtime as needed.      [provider]  amitriptyline (ELAVIL) 10 MG tablet Take 10 mg by mouth at bedtime.      [provider]  metroNIDAZOLE (FLAGYL) 500 MG tablet Take 1 tablet (500 mg total) by mouth 2 (two) times daily. 04/21/16   Tery Sanfilippo, MD      Allergies    Patient has no known allergies.    Review of Systems   Review of Systems  Genitourinary:  Positive for flank pain.    Physical Exam Updated Vital Signs BP (!)  152/99   Pulse 75   Temp 98.9 F (37.2 C) (Oral)   Resp 18   Ht 5\' 8"  (1.727 m)   Wt 113.4 kg   LMP 08/20/2022 (Approximate)   SpO2 100%   BMI 38.01 kg/m  Physical Exam Vitals and nursing note reviewed.  Constitutional:      Appearance: Normal appearance.  HENT:     Head: Normocephalic and atraumatic.     Mouth/Throat:     Mouth: Mucous membranes are moist.  Eyes:     General: No scleral icterus. Cardiovascular:     Rate and Rhythm: Normal rate and regular rhythm.     Pulses: Normal pulses.     Heart sounds: Normal heart sounds.  Pulmonary:     Effort: Pulmonary effort is normal.     Breath sounds: Normal breath sounds.  Abdominal:     General: Abdomen is flat.     Palpations: Abdomen is soft.     Tenderness: There is no abdominal tenderness.  Musculoskeletal:        General: No deformity.     Comments: Tenderness to palpation to left flank and suprapubic area.  Skin:    General: Skin is warm.     Findings: No rash.  Neurological:     General: No focal deficit present.  Mental Status: She is alert.  Psychiatric:        Mood and Affect: Mood normal.     ED Results / Procedures / Treatments   Labs (all labs ordered are listed, but only abnormal results are displayed) Labs Reviewed  COMPREHENSIVE METABOLIC PANEL - Abnormal; Notable for the following components:      Result Value   Sodium 134 (*)    Potassium 3.0 (*)    All other components within normal limits  URINALYSIS, ROUTINE W REFLEX MICROSCOPIC - Abnormal; Notable for the following components:   Hgb urine dipstick TRACE (*)    All other components within normal limits  CBC WITH DIFFERENTIAL/PLATELET  PREGNANCY, URINE  URINALYSIS, MICROSCOPIC (REFLEX)    EKG None  Radiology CT Renal Stone Study  Result Date: 09/07/2022 CLINICAL DATA:  Left flank pain EXAM: CT ABDOMEN AND PELVIS WITHOUT CONTRAST TECHNIQUE: Multidetector CT imaging of the abdomen and pelvis was performed following the standard  protocol without IV contrast. RADIATION DOSE REDUCTION: This exam was performed according to the departmental dose-optimization program which includes automated exposure control, adjustment of the mA and/or kV according to patient size and/or use of iterative reconstruction technique. COMPARISON:  07/14/2021 FINDINGS: Lower chest: Included lung bases are clear.  Heart size is normal. Hepatobiliary: No focal liver abnormality is seen. Status post cholecystectomy. No biliary dilatation. Pancreas: Unremarkable. No pancreatic ductal dilatation or surrounding inflammatory changes. Spleen: Normal in size without focal abnormality. Adrenals/Urinary Tract: 1.2 cm right adrenal gland nodule with internal density of -2 HU, compatible with a benign adenoma. No follow-up imaging recommended. Normal left adrenal gland. Tiny 1 mm nonobstructing stone within the right kidney. No left-sided renal calculi. No ureteral calculi are identified. No hydronephrosis. Urinary bladder within normal limits for the degree of distension. Stomach/Bowel: Stomach is within normal limits. Appendix appears normal. No evidence of bowel wall thickening, distention, or inflammatory changes. Vascular/Lymphatic: No significant vascular findings are present. No enlarged abdominal or pelvic lymph nodes. Reproductive: Uterus and bilateral adnexa are unremarkable. Other: No free fluid. No abdominopelvic fluid collection. No pneumoperitoneum. Musculoskeletal: No acute or significant osseous findings. IMPRESSION: 1. No acute abdominopelvic findings. 2. Tiny 1 mm nonobstructing stone within the right kidney. Electronically Signed   By: Duanne Guess D.O.   On: 09/07/2022 19:27    Procedures Procedures  {Document cardiac monitor, telemetry assessment procedure when appropriate:1}  Medications Ordered in ED Medications  ketorolac (TORADOL) 15 MG/ML injection 15 mg (15 mg Intravenous Given 09/07/22 1844)    ED Course/ Medical Decision Making/ A&P                            Medical Decision Making Risk Prescription drug management.   This patient presents to the ED for low back pain, left flank pain, dysuria, this involves an extensive number of treatment options, and is a complaint that carries with a high risk of complications and morbidity.  The differential diagnosis includes radiculopathy, kidney stone, ectopic pregnancy, ovarian torsion, UTI, STD, infectious etiology.  This is not an exhaustive list.  Comorbidities that complicate the patient evaluation See HPI  Social determinants of health NA  Additional history obtained: External records from outside source obtained and reviewed including: Chart review including previous notes, labs, imaging.  Cardiac monitoring/EKG: The patient was maintained on a cardiac monitor.  I personally reviewed and interpreted the cardiac monitor which showed an underlying rhythm of: Sinus rhythm.  Lab tests: I ordered  and personally interpreted labs.  The pertinent results include: WBC unremarkable. Hbg unremarkable. Platelets unremarkable. No electrolyte abnormalities noted. BUN, creatinine unremarkable. UA significant for no acute abnormality.   Imaging studies: I ordered imaging studies including: CT renal stone study show 1 mm nonobstructing stone in the right kidney. I personally reviewed, interpreted imaging and agree with the radiologist's interpretations.  Problem list/ ED course/ Critical interventions/ Medical management: HPI: See above Vital signs significant for blood pressure 152/99 within normal range and stable throughout visit. Laboratory/imaging studies significant for: See above. On physical examination, patient is afebrile and appears in no acute distress.  There was tenderness to palpation to the left flank and suprapubic area.  CT renal stone showed a 1 mm nonobstructing stone in the right kidney.  I have low suspicion this is the cause of her left flank pain.  Patient  states she has increased pain on the flank with deep inhalation.  She is PERC negative.  Based on patient's clinical presentations and laboratory/imaging studies I suspect musculoskeletal pain.  Toradol order reevaluation of the patient after these medications showed that the patient improved.  I will send an Rx of Toradol.  Advised patient to take Toradol or Tylenol/ibuprofen for pain.  Follow-up with her primary care physician for further evaluation and management.  Return to the ER if she develop tachycardia, shortness of breath, tachypnea. I have reviewed the patient home medicines and have made adjustments as needed.  Consultations obtained:  Disposition Continued outpatient therapy. Follow-up with PCP recommended for reevaluation of symptoms. Treatment plan discussed with patient.  Pt acknowledged understanding was agreeable to the plan. Worrisome signs and symptoms were discussed with patient, and patient acknowledged understanding to return to the ED if they noticed these signs and symptoms. Patient was stable upon discharge.   This chart was dictated using voice recognition software.  Despite best efforts to proofread,  errors can occur which can change the documentation meaning.    {Document critical care time when appropriate:1} {Document review of labs and clinical decision tools ie heart score, Chads2Vasc2 etc:1}  {Document your independent review of radiology images, and any outside records:1} {Document your discussion with family members, caretakers, and with consultants:1} {Document social determinants of health affecting pt's care:1} {Document your decision making why or why not admission, treatments were needed:1} Final Clinical Impression(s) / ED Diagnoses Final diagnoses:  Flank pain  Kidney stone    Rx / DC Orders ED Discharge Orders          Ordered    ketorolac (TORADOL) 10 MG tablet  Every 6 hours PRN        09/07/22 1953
